# Patient Record
Sex: Male | Born: 1985 | Race: Black or African American | Hispanic: No | Marital: Married | Smoking: Current some day smoker
Health system: Southern US, Community
[De-identification: ages and names within clinical notes are randomized; demographics above are authoritative.]

---

## 2019-05-28 ENCOUNTER — Emergency Department (HOSPITAL_COMMUNITY): Payer: 59

## 2019-05-28 ENCOUNTER — Emergency Department (HOSPITAL_COMMUNITY)
Admission: EM | Admit: 2019-05-28 | Discharge: 2019-05-29 | Disposition: A | Discharge status: Home / Self Care | Payer: 59 | Attending: Emergency Medicine | Admitting: Emergency Medicine

## 2019-05-28 ENCOUNTER — Other Ambulatory Visit: Payer: Self-pay

## 2019-05-28 ENCOUNTER — Encounter (HOSPITAL_COMMUNITY): Payer: Self-pay | Admitting: Emergency Medicine

## 2019-05-28 DIAGNOSIS — Y999 Unspecified external cause status: Secondary | ICD-10-CM | POA: Insufficient documentation

## 2019-05-28 DIAGNOSIS — W109XXA Fall (on) (from) unspecified stairs and steps, initial encounter: Secondary | ICD-10-CM | POA: Diagnosis not present

## 2019-05-28 DIAGNOSIS — Y929 Unspecified place or not applicable: Secondary | ICD-10-CM | POA: Diagnosis not present

## 2019-05-28 DIAGNOSIS — Y9301 Activity, walking, marching and hiking: Secondary | ICD-10-CM | POA: Insufficient documentation

## 2019-05-28 DIAGNOSIS — F17210 Nicotine dependence, cigarettes, uncomplicated: Secondary | ICD-10-CM | POA: Insufficient documentation

## 2019-05-28 DIAGNOSIS — S82832A Other fracture of upper and lower end of left fibula, initial encounter for closed fracture: Secondary | ICD-10-CM | POA: Diagnosis not present

## 2019-05-28 DIAGNOSIS — S99912A Unspecified injury of left ankle, initial encounter: Secondary | ICD-10-CM | POA: Diagnosis present

## 2019-05-28 NOTE — ED Triage Notes (Addendum)
Pt here after "twisting" his LEFT ankle. Pt with obvious swelling to LEFT ankle.

## 2019-05-29 MED ORDER — IBUPROFEN 400 MG PO TABS
600.0000 mg | ORAL_TABLET | Freq: Once | ORAL | Status: AC
  Administered 2019-05-29: 600 mg via ORAL
  Filled 2019-05-29: qty 2

## 2019-05-29 MED ORDER — HYDROCODONE-ACETAMINOPHEN 5-325 MG PO TABS: 1.0000 | ORAL_TABLET | Freq: Four times a day (QID) | ORAL | 0 refills | Status: AC | PRN

## 2019-05-29 MED ORDER — HYDROCODONE-ACETAMINOPHEN 5-325 MG PO TABS
1.0000 | ORAL_TABLET | Freq: Once | ORAL | Status: AC
  Administered 2019-05-29: 1 via ORAL
  Filled 2019-05-29: qty 1

## 2019-05-29 NOTE — ED Provider Notes (Signed)
[facility] EMERGENCY DEPARTMENT Provider Note   CSN: [id number] Arrival date & time: 05/28/19  2301   Time seen 12:05 AM  History Chief Complaint  Patient presents with  . Ankle Pain    Nathan Huff is a 34 y.o. male.  HPI Patient states about 3 hours ago he was going down the steps off his porch and one of the steps broke and he fell twisting his left ankle.  He states he hit his left ribs and they are sore but he does not have significant pain or shortness of breath.  He states he has pain and difficulty putting weight on his left foot since he fell.  PCP Filbert Berthold, MD Orthopedics [facility] near [facility]    History reviewed. No pertinent past medical history.  There are no problems to display for this patient.   History reviewed. No pertinent surgical history.     History reviewed. No pertinent family history.  Social History   Tobacco Use  . Smoking status: Current Some Day Smoker  . Smokeless tobacco: Never Used  Substance Use Topics  . Alcohol use: Yes  . Drug use: Never  lives with spouse  Home Medications Prior to Admission medications   Medication Sig Start Date End Date Taking? Authorizing Provider  HYDROcodone-acetaminophen (NORCO/VICODIN) 5-325 MG tablet Take 1 tablet by mouth every 6 (six) hours as needed for moderate pain or severe pain. 05/29/19   Devoria Albe, MD    Allergies    Cortizone-5 [hydrocortisone]  Review of Systems   Review of Systems  All other systems reviewed and are negative.   Physical Exam Updated Vital Signs BP 133/78 (BP Location: Left Arm)   Pulse 79   Temp 98 F (36.7 C) (Oral)   Resp 15   Ht 6' (1.829 m)   Wt 63.5 kg   SpO2 98%   BMI 18.99 kg/m   Physical Exam Vitals and nursing note reviewed.  Constitutional:      Appearance: Normal appearance. He is normal weight.  HENT:     Head: Normocephalic and atraumatic.  Eyes:     Extraocular Movements: Extraocular movements intact.   Conjunctiva/sclera: Conjunctivae normal.     Pupils: Pupils are equal, round, and reactive to light.  Cardiovascular:     Rate and Rhythm: Normal rate and regular rhythm.     Pulses: Normal pulses.  Pulmonary:     Effort: Pulmonary effort is normal.  Musculoskeletal:        General: Swelling present.     Comments: Patient has a lot of bruising over his medial malleolus and especially over his lateral malleolus with early bruising seen.  He has some mild tenderness over the medial malleolus but he is most tender over the lateral malleolus.  He has good distal pulses.  He is nontender over his knee.  Skin:    General: Skin is warm and dry.     Findings: Bruising present.  Neurological:     General: No focal deficit present.     Mental Status: He is alert and oriented to person, place, and time.     Cranial Nerves: No cranial nerve deficit.  Psychiatric:        Mood and Affect: Mood normal.        Behavior: Behavior normal.        Thought Content: Thought content normal.    Left medial malleolus     Left Lateral malleolus     ED Results / Procedures /  Treatments   Labs (all labs ordered are listed, but only abnormal results are displayed) Labs Reviewed - No data to display  EKG None  Radiology DG Ankle Complete Left  Result Date: 05/28/2019 CLINICAL DATA:  [place], pain and swelling EXAM: LEFT ANKLE COMPLETE - 3+ VIEW COMPARISON:  None. FINDINGS: Frontal, oblique, and lateral views of the left ankle demonstrate an oblique lateral malleolar fracture which is minimally displaced. No other acute bony abnormalities. The ankle mortise is intact. There is severe anterior and lateral soft tissue edema. IMPRESSION: 1. Oblique lateral malleolar fracture with significant overlying soft tissue swelling. Electronically Signed   By: Randa Ngo M.D.   On: 05/28/2019 23:47    Procedures Procedures (including critical care time)  Medications Ordered in ED Medications  ibuprofen  (ADVIL) tablet 600 mg (has no administration in time range)  HYDROcodone-acetaminophen (NORCO/VICODIN) 5-325 MG per tablet 1 tablet (has no administration in time range)    ED Course  I have reviewed the triage vital signs and the nursing notes.  Pertinent labs & imaging results that were available during my care of the patient were reviewed by me and considered in my medical decision making (see chart for details).    MDM Rules/Calculators/A&P                     Patient does not want his rib soreness to be addressed.  We discussed his x-ray result.  He was placed in a cam walker and crutches.  He states he has an orthopedist group he has seen before near [facility]  He was advised to follow-up with them.  He was offered IM Toradol but he chose to take oral medications.  With the degree of swelling over his medial malleolus I suspect he also may have some type of tendon injury there that needs to be assessed by his orthopedist.    Final Clinical Impression(s) / ED Diagnoses Final diagnoses:  Closed fracture of distal end of left fibula, unspecified fracture morphology, initial encounter    Rx / DC Orders ED Discharge Orders         Ordered    HYDROcodone-acetaminophen (NORCO/VICODIN) 5-325 MG tablet  Every 6 hours PRN     05/29/19 0022        OTC ibuprofen  Plan discharge  Rolland Porter, MD, Barbette Or, MD 05/29/19 Benancio Deeds

## 2019-05-29 NOTE — Discharge Instructions (Signed)
Elevate your ankle to help lessen the swelling.  Use ice packs for swelling and for comfort.  Take ibuprofen 400 to 600 mg 4 times a day for pain.  Use the hydrocodone to help you sleep at night.  Use the crutches until you are able to bear weight on your left leg.  Use the cam walker whenever you are putting weight on that leg.  Please call [facility] to have them reevaluate your ankle soon.  You have the fracture of your distal fibula but I suspect you have a major sprain of your medial ankle also.

## 2019-07-01 ENCOUNTER — Ambulatory Visit
Admission: EM | Admit: 2019-07-01 | Discharge: 2019-07-01 | Disposition: A | Discharge status: Home / Self Care | Payer: 59 | Attending: Emergency Medicine | Admitting: Emergency Medicine

## 2019-07-01 DIAGNOSIS — S61211A Laceration without foreign body of left index finger without damage to nail, initial encounter: Secondary | ICD-10-CM

## 2019-07-01 MED ORDER — DOXYCYCLINE HYCLATE 100 MG PO CAPS
100.0000 mg | ORAL_CAPSULE | Freq: Two times a day (BID) | ORAL | 0 refills | Status: DC
Start: 2019-07-01 — End: 2022-05-24

## 2019-07-01 NOTE — Discharge Instructions (Addendum)
Clean with warm water and mild soap.   Apply a thin layer of neosporin.  Antibiotic was prescribed/take as directed Take OTC ibuprofen or tylenol as needed for pain releif Return sooner or go to the ED if you have any new or worsening symptoms such as increased pain, redness, swelling, drainage, discharge, decreased range of motion of extremity, etc..

## 2019-07-01 NOTE — ED Provider Notes (Addendum)
[facility]   [id number] 07/01/19 Arrival Time: 1245  Chief Complaint  Patient presents with   Hand Injury     SUBJECTIVE:  Laronn Kempa is a 34 y.o. male who presents to the urgent care for complaint of left index finger laceration  for the past 2 weeks.  Now reporting swelling and increased pain at the site.  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  Bleeding controlled.  Currently not on blood thinners.  Denies similar symptoms in the past.  Denies fever, chills, nausea, vomiting, redness, swelling, purulent drainage, decrease strength or sensation.   Td UTD: Yes.  ROS: As per HPI.  All other pertinent ROS negative.     History reviewed. No pertinent past medical history. History reviewed. No pertinent surgical history. Allergies  Allergen Reactions   Cortizone-5 [Hydrocortisone]    No current facility-administered medications on file prior to encounter.   Current Outpatient Medications on File Prior to Encounter  Medication Sig Dispense Refill   HYDROcodone-acetaminophen (NORCO/VICODIN) 5-325 MG tablet Take 1 tablet by mouth every 6 (six) hours as needed for moderate pain or severe pain. 15 tablet 0   Social History   Socioeconomic History   Marital status: Married    Spouse name: Not on file   Number of children: Not on file   Years of education: Not on file   Highest education level: Not on file  Occupational History   Not on file  Tobacco Use   Smoking status: Current Some Day Smoker   Smokeless tobacco: Never Used  Substance and Sexual Activity   Alcohol use: Yes   Drug use: Never   Sexual activity: Not on file  Other Topics Concern   Not on file  Social History Narrative   Not on file   Social Determinants of Health   Financial Resource Strain:    Difficulty of Paying Living Expenses:   Food Insecurity:    Worried About Programme researcher, broadcasting/film/video in the Last Year:    Barista in the Last Year:     Transportation Needs:    Freight forwarder (Medical):    Lack of Transportation (Non-Medical):   Physical Activity:    Days of Exercise per Week:    Minutes of Exercise per Session:   Stress:    Feeling of Stress :   Social Connections:    Frequency of Communication with Friends and Family:    Frequency of Social Gatherings with Friends and Family:    Attends Religious Services:    Active Member of Clubs or Organizations:    Attends Engineer, structural:    Marital Status:   Intimate Partner Violence:    Fear of Current or Ex-Partner:    Emotionally Abused:    Physically Abused:    Sexually Abused:    History reviewed. No pertinent family history.   OBJECTIVE:  Vitals:   07/01/19 1257  BP: 112/63  Pulse: 68  Resp: 18  Temp: 98 F (36.7 C)  SpO2: 97%     General appearance: alert; no distress Heart: RRR stable no murmur, rub or gallop Chest: CTA, heart sounds normal Skin: laceration of index finger; size: approx 0.5  cm Psychological: alert and cooperative; normal mood and affect   No results found for this or any previous visit.  Labs Reviewed - No data to display  No results found.  Procedure:   ASSESSMENT & PLAN:  1. Laceration of left index finger  without damage to nail, foreign body presence unspecified, initial encounter     Meds ordered this encounter  Medications   doxycycline (VIBRAMYCIN) 100 MG capsule    Sig: Take 1 capsule (100 mg total) by mouth 2 (two) times daily.    Dispense:  20 capsule    Refill:  0   Discharge Instructions Clean with warm water and mild soap.   Apply a thin layer of neosporin.  Antibiotic was prescribed/take as directed Take OTC ibuprofen or tylenol as needed for pain releif Return sooner or go to the ED if you have any new or worsening symptoms such as increased pain, redness, swelling, drainage, discharge, decreased range of motion of extremity, etc..     Reviewed expectations  re: course of current medical issues. Questions answered. Outlined signs and symptoms indicating need for more acute intervention. Patient verbalized understanding. After Visit Summary given.    Note: This document was prepared using Dragon voice recognition software and may include unintentional dictation errors.        Emerson Monte, [place] 07/01/19 1312

## 2019-07-01 NOTE — ED Triage Notes (Signed)
Pt presents with swelling to left finger after laceration 2 weeks ago

## 2021-07-10 IMAGING — DX DG ANKLE COMPLETE 3+V*L*
3 series · 3 of 3 positions shown · non-contrast
Comparison: None.

CLINICAL DATA: Fell, pain and swelling

EXAM:
LEFT ANKLE COMPLETE - 3+ VIEW

[ankle ap]
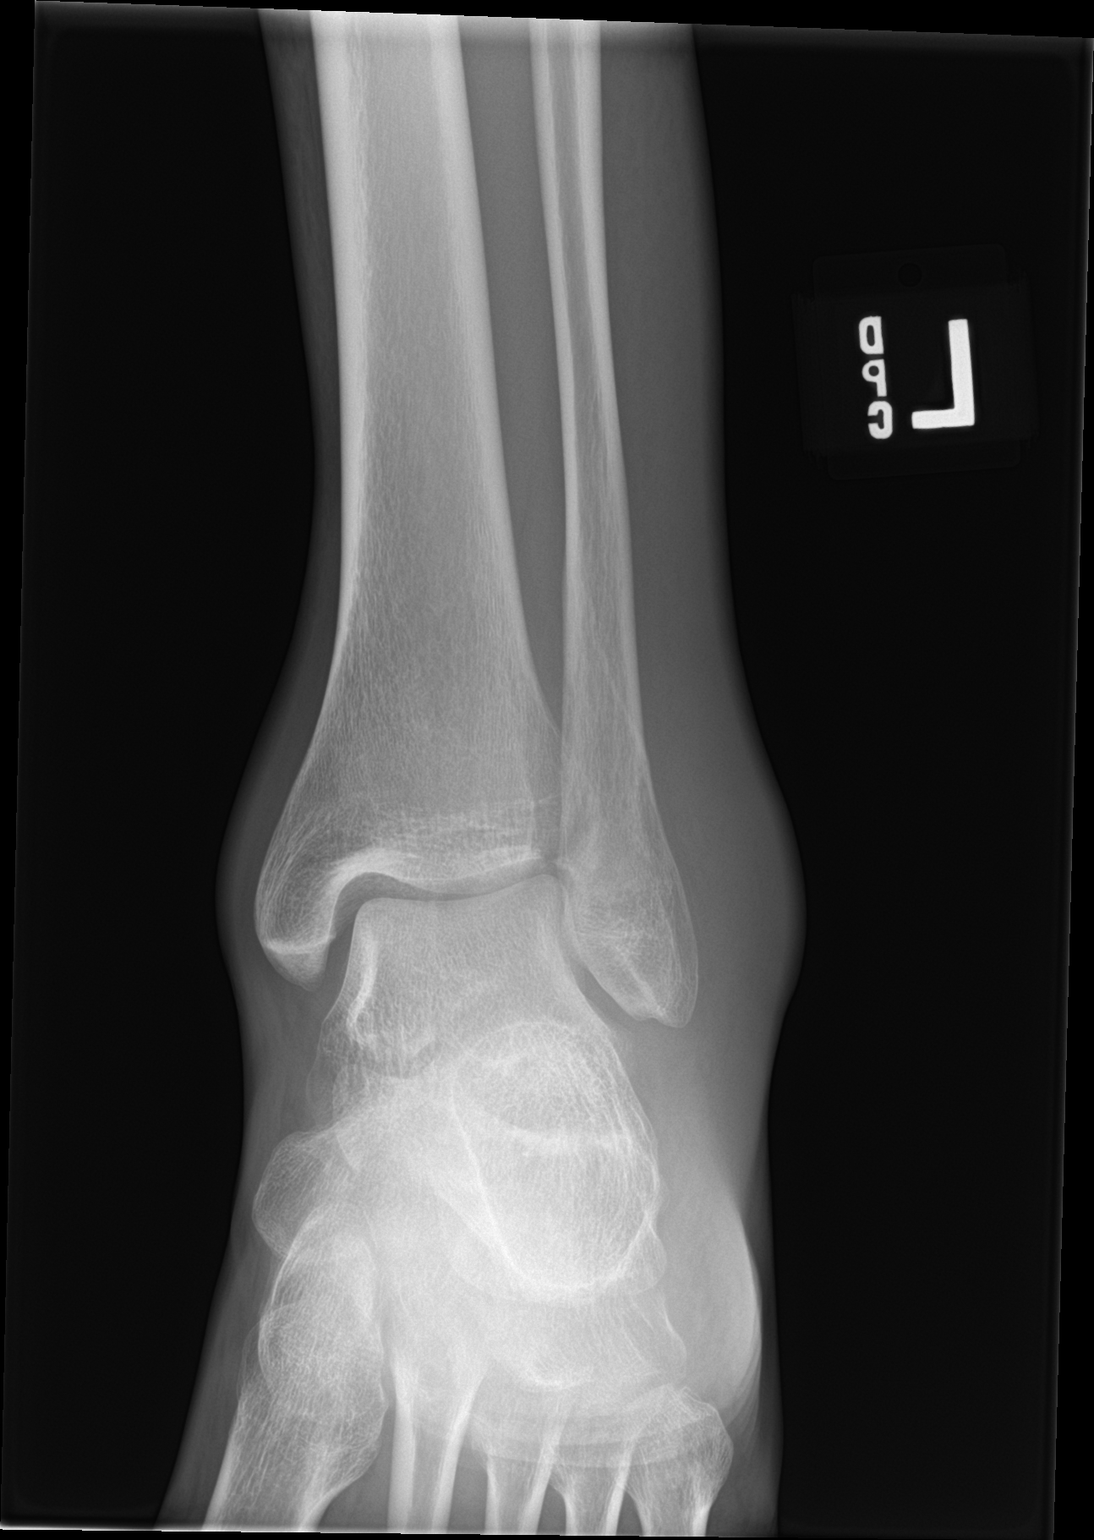

[ankle obl]
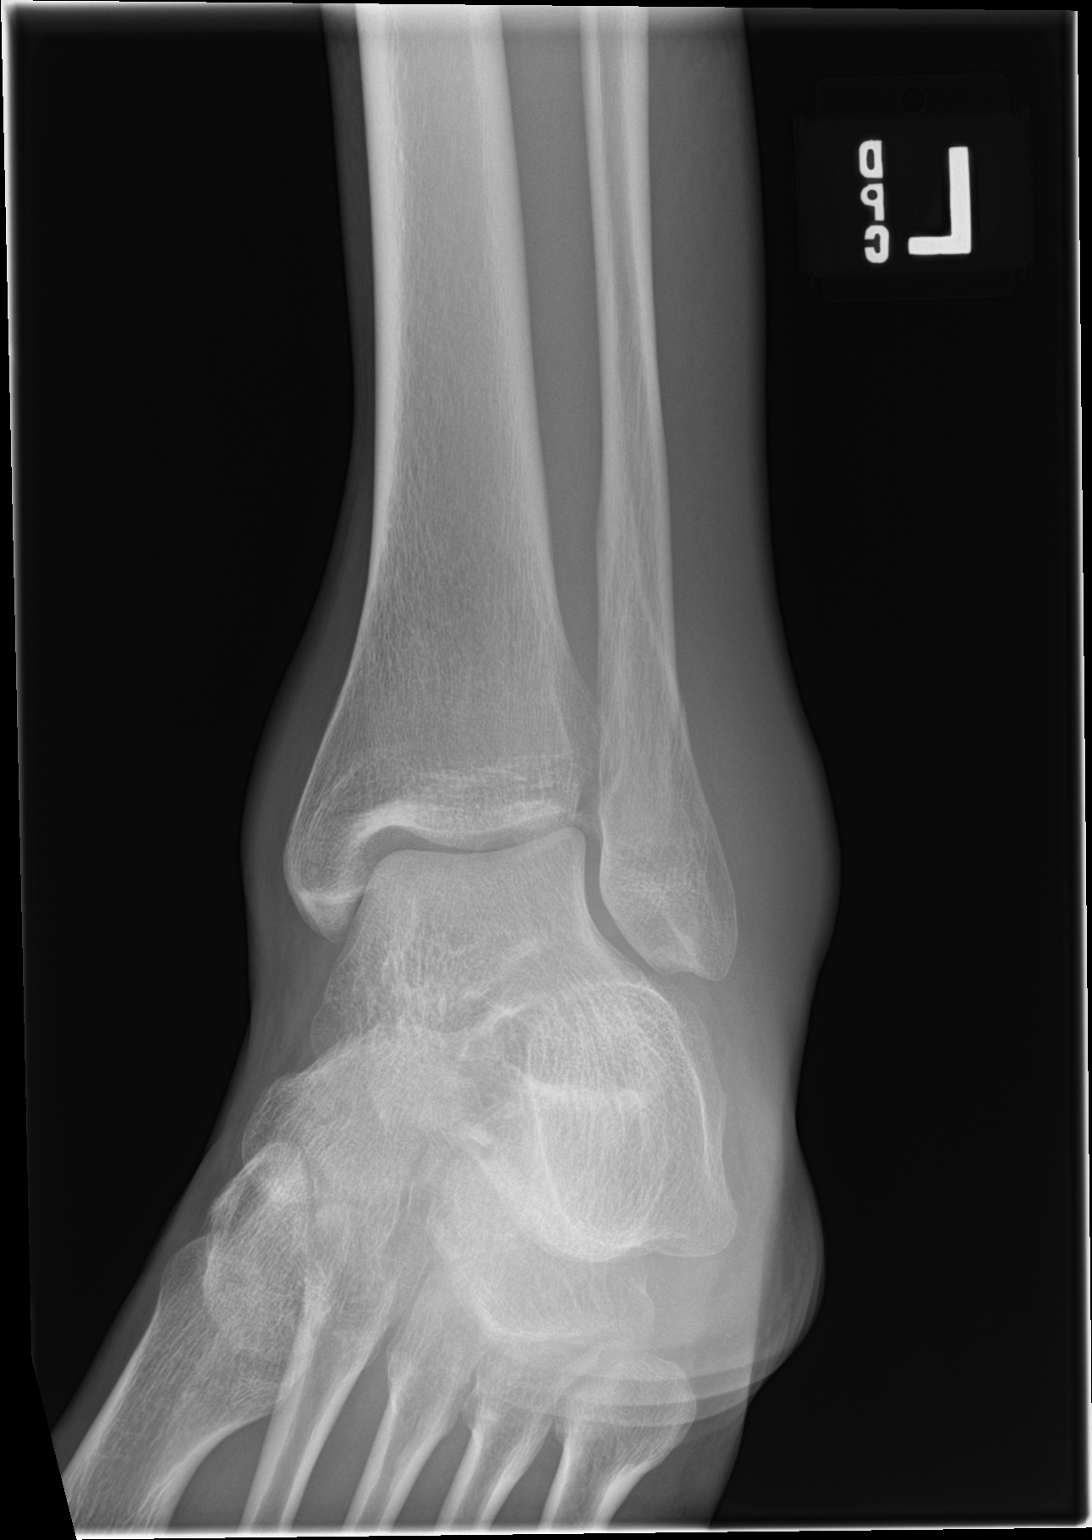

[ankle lat]
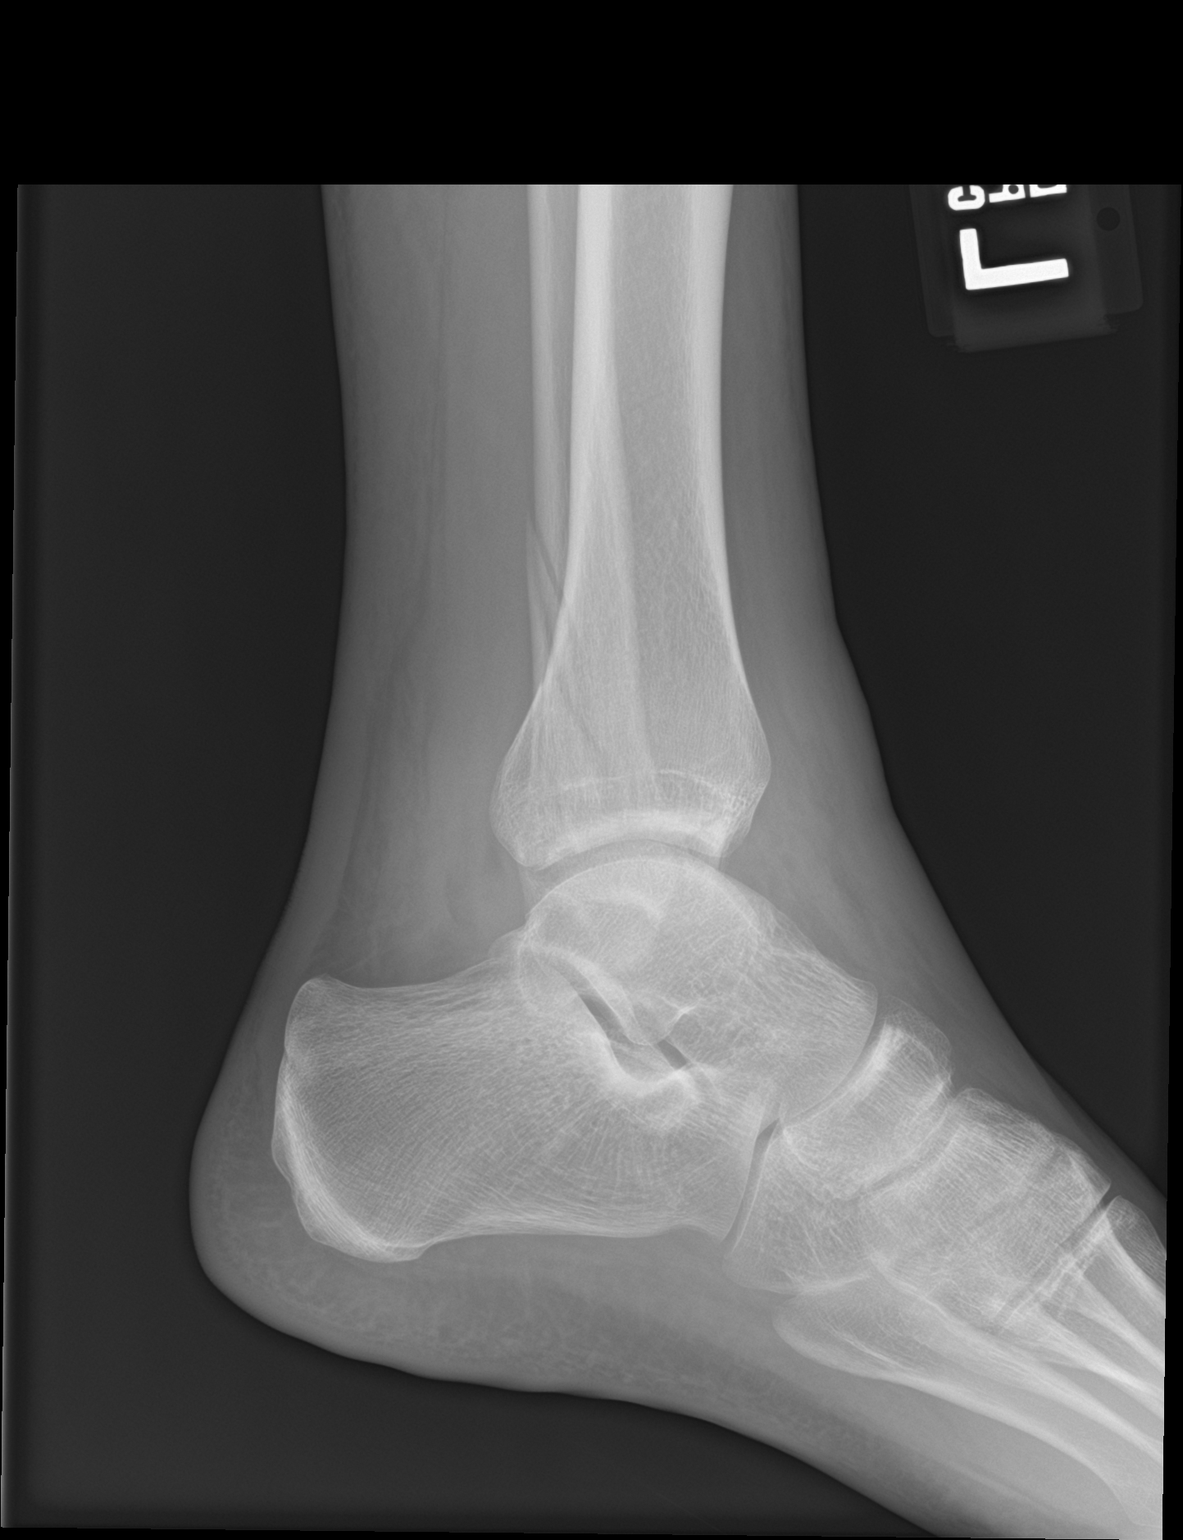

[3 of 3 positions shown; findings below may reference images not displayed]

FINDINGS: Frontal, oblique, and lateral views of the left ankle demonstrate an
oblique lateral malleolar fracture which is minimally displaced. No
other acute bony abnormalities. The ankle mortise is intact. There
is severe anterior and lateral soft tissue edema.
IMPRESSION: 1. Oblique lateral malleolar fracture with significant overlying
soft tissue swelling.

## 2022-05-24 ENCOUNTER — Emergency Department (HOSPITAL_COMMUNITY)
Admission: EM | Admit: 2022-05-24 | Discharge: 2022-05-24 | Disposition: A | Discharge status: Home / Self Care | Payer: Medicaid Other | Attending: Student | Admitting: Student

## 2022-05-24 ENCOUNTER — Other Ambulatory Visit: Payer: Self-pay

## 2022-05-24 DIAGNOSIS — L03115 Cellulitis of right lower limb: Secondary | ICD-10-CM

## 2022-05-24 DIAGNOSIS — M7989 Other specified soft tissue disorders: Secondary | ICD-10-CM | POA: Diagnosis present

## 2022-05-24 MED ORDER — DOXYCYCLINE HYCLATE 100 MG PO CAPS: 100.0000 mg | ORAL_CAPSULE | Freq: Two times a day (BID) | ORAL | 0 refills | Status: AC

## 2022-05-24 NOTE — ED Triage Notes (Signed)
Pt has noticed a "knot" on his right shin that has been there several days. Swelling has gone down but not entirely.  Pt states it was painful but not anymore. Pt has a procedure scheduled tomorrow to have some abscesses on his face and left elbow drained so he would just like someone to look at it and be sure it isn't anything dangerous.

## 2022-05-24 NOTE — Discharge Instructions (Addendum)
Seen today for a bump on your leg.  It is red and tender, looks like it might be an early abscess.  Will give you antibiotics.  You can use warm compresses.  If it is getting worse come back to the ER.  Was follow-up close with your primary care doctor.

## 2022-05-24 NOTE — ED Provider Notes (Signed)
[place] EMERGENCY DEPARTMENT AT [facility] Provider Note   CSN: [id number] Arrival date & time: 05/24/22  1725     History  Chief Complaint  Patient presents with   Cyst    Dustin Haddow is a 37 y.o. male.  Is a significant past medical history.  Presents the ER today complaining of a bump on the right anterior leg.  Several days ago he had woken up and noticed it was swollen and red.  He states it has been tender.  The swelling has gone down somewhat.  He states he is wanted to have it checked out because he wanted to make sure he was okay to have surgery to remove a cyst on his face tomorrow.  Denies any calf swelling or tenderness.  Denies any injury.  No numbness or tingling.  HPI     Home Medications Prior to Admission medications   Medication Sig Start Date End Date Taking? Authorizing Provider  doxycycline (VIBRAMYCIN) 100 MG capsule Take 1 capsule (100 mg total) by mouth 2 (two) times daily for 5 days. 05/24/22 05/29/22  Carmel Sacramento A, PA-C  HYDROcodone-acetaminophen (NORCO/VICODIN) 5-325 MG tablet Take 1 tablet by mouth every 6 (six) hours as needed for moderate pain or severe pain. 05/29/19   Devoria Albe, MD      Allergies    Cortizone-5 [hydrocortisone]    Review of Systems   Review of Systems  Physical Exam Updated Vital Signs BP 132/84 (BP Location: Right Arm)   Pulse 72   Temp 98 F (36.7 C) (Oral)   Resp 18   SpO2 100%  Physical Exam Vitals and nursing note reviewed.  Constitutional:      General: He is not in acute distress.    Appearance: He is well-developed.  HENT:     Head: Normocephalic and atraumatic.  Eyes:     Conjunctiva/sclera: Conjunctivae normal.  Cardiovascular:     Rate and Rhythm: Normal rate and regular rhythm.     Heart sounds: No murmur heard. Pulmonary:     Effort: Pulmonary effort is normal. No respiratory distress.     Breath sounds: Normal breath sounds.  Abdominal:     Palpations: Abdomen is soft.      Tenderness: There is no abdominal tenderness.  Musculoskeletal:        General: No swelling or tenderness.     Cervical back: Neck supple.     Right lower leg: No edema.     Left lower leg: No edema.  Skin:    General: Skin is warm and dry.     Capillary Refill: Capillary refill takes less than 2 seconds.     Comments: Approximately 1 cm diameter area of tenderness with some subcutaneous swelling, overlying erythema is mild.  Area is tender to palpation.  Neurological:     General: No focal deficit present.     Mental Status: He is alert and oriented to person, place, and time.  Psychiatric:        Mood and Affect: Mood normal.     ED Results / Procedures / Treatments   Labs (all labs ordered are listed, but only abnormal results are displayed) Labs Reviewed - No data to display  EKG None  Radiology No results found.  Procedures Ultrasound ED Soft Tissue  Date/Time: 05/24/2022 7:33 PM  Performed by: Ma Rings, PA-C Authorized by: Ma Rings, PA-C   Procedure details:    Indications: localization of abscess  Transverse view:  Visualized   Longitudinal view:  Visualized   Images: not archived   Location:    Side:  Right Findings:     no abscess present    cellulitis present    no foreign body present     Medications Ordered in ED Medications - No data to display  ED Course/ Medical Decision Making/ A&P                             Medical Decision Making DDX: Abscess, cellulitis, local reaction to insect bite, other ED course: Patient presents to ER with right anterior shin bump with redness and tenderness.  It was never itchy, no drainage.  He has a small area consistent with a small developing abscess.  As the redness has been more severe and is starting to go down on its own.  On bedside ultrasound there is a small hypoechoic area with some surrounding edema, no abscess.  No overlying cellulitis.  Discussed with patient this likely infection  but there is no indication for drainage as there is no definite abscess.  Will give him antibiotics.  He has no calf swelling or tenderness, no pitting edema to suggest DVT.  He was advised on close follow-up and strict return precautions  Risk Prescription drug management.           Final Clinical Impression(s) / ED Diagnoses Final diagnoses:  Cellulitis of right lower extremity    Rx / DC Orders ED Discharge Orders          Ordered    doxycycline (VIBRAMYCIN) 100 MG capsule  2 times daily        05/24/22 1850              Josem Kaufmann 05/24/22 1934    Lonell Grandchild, MD 05/25/22 [phone]

## 2023-02-15 ENCOUNTER — Ambulatory Visit
Admission: EM | Admit: 2023-02-15 | Discharge: 2023-02-15 | Disposition: A | Discharge status: Home / Self Care | Payer: Medicaid Other | Attending: Family Medicine | Admitting: Family Medicine

## 2023-02-15 DIAGNOSIS — H538 Other visual disturbances: Secondary | ICD-10-CM

## 2023-02-15 DIAGNOSIS — L0201 Cutaneous abscess of face: Secondary | ICD-10-CM | POA: Diagnosis not present

## 2023-02-15 DIAGNOSIS — H5712 Ocular pain, left eye: Secondary | ICD-10-CM | POA: Diagnosis not present

## 2023-02-15 MED ORDER — ERYTHROMYCIN 5 MG/GM OP OINT: TOPICAL_OINTMENT | OPHTHALMIC | 0 refills | Status: AC

## 2023-02-15 NOTE — ED Triage Notes (Signed)
Pt states ingrown hair to right cheek states the area is swollen.  States his left eye hurts and his vision is blurry.  States it has been for the past 3 days.

## 2023-02-15 NOTE — ED Provider Notes (Signed)
[facility]    CSN: [id number] Arrival date & time: 02/15/23  1449      History   Chief Complaint Chief Complaint  Patient presents with   Abscess    HPI Nathan Huff is a 38 y.o. male.   Patient presenting today with a resolving ingrown hair causing an abscess to the right cheek that he states he was able to get to drain spontaneously several days ago and is improving.  Since that started, he has had about 3 days of left upper eyelid/eye pain with moving the eye and blurry vision.  Denies eye redness, drainage, itching, fever, nausea, vomiting, headache, swelling, known injury to the eye.  So far trying lubricating drops with minimal relief.    History reviewed. No pertinent past medical history.  There are no active problems to display for this patient.   History reviewed. No pertinent surgical history.     Home Medications    Prior to Admission medications   Medication Sig Start Date End Date Taking? Authorizing Provider  erythromycin ophthalmic ointment Place a 1/2 inch ribbon of ointment into the left lower eyelid BID prn. 02/15/23  Yes Particia Nearing, PA-C  HYDROcodone-acetaminophen (NORCO/VICODIN) 5-325 MG tablet Take 1 tablet by mouth every 6 (six) hours as needed for moderate pain or severe pain. 05/29/19   Devoria Albe, MD    Family History History reviewed. No pertinent family history.  Social History Social History   Tobacco Use   Smoking status: Some Days   Smokeless tobacco: Never  Substance Use Topics   Alcohol use: Yes   Drug use: Never     Allergies   Cortizone-5 [hydrocortisone]   Review of Systems Review of Systems Per HPI  Physical Exam Triage Vital Signs ED Triage Vitals  Encounter Vitals Group     BP 02/15/23 1620 116/77     Systolic BP Percentile --      Diastolic BP Percentile --      Pulse Rate 02/15/23 1620 64     Resp 02/15/23 1620 16     Temp 02/15/23 1620 98 F (36.7 C)     Temp Source 02/15/23  1620 Oral     SpO2 02/15/23 1620 96 %     Weight --      Height --      Head Circumference --      Peak Flow --      Pain Score 02/15/23 1622 0     Pain Loc --      Pain Education --      Exclude from Growth Chart --    No data found.  Updated Vital Signs BP 116/77 (BP Location: Right Arm)   Pulse 64   Temp 98 F (36.7 C) (Oral)   Resp 16   SpO2 96%   Visual Acuity Right Eye Distance: 20/10 Left Eye Distance: 20/20 Bilateral Distance: 20/10  Right Eye Near:   Left Eye Near:    Bilateral Near:     Physical Exam Vitals and nursing note reviewed.  Constitutional:      Appearance: Normal appearance.  HENT:     Head: Atraumatic.  Eyes:     General:        Right eye: No discharge.        Left eye: No discharge.     Extraocular Movements: Extraocular movements intact.     Conjunctiva/sclera: Conjunctivae normal.     Pupils: Pupils are equal, round, and reactive to light.  Cardiovascular:     Rate and Rhythm: Normal rate and regular rhythm.  Pulmonary:     Effort: Pulmonary effort is normal.     Breath sounds: Normal breath sounds.  Musculoskeletal:        General: Normal range of motion.     Cervical back: Normal range of motion and neck supple.  Skin:    General: Skin is warm and dry.     Comments: Small hyperpigmented nodular lesion to the lower right cheek in area of recent abscess that has drained  Neurological:     General: No focal deficit present.     Mental Status: He is oriented to person, place, and time.     Motor: No weakness.     Gait: Gait normal.  Psychiatric:        Mood and Affect: Mood normal.        Thought Content: Thought content normal.        Judgment: Judgment normal.      UC Treatments / Results  Labs (all labs ordered are listed, but only abnormal results are displayed) Labs Reviewed - No data to display  EKG   Radiology No results found.  Procedures Procedures (including critical care time)  Medications Ordered in  UC Medications - No data to display  Initial Impression / Assessment and Plan / UC Course  I have reviewed the triage vital signs and the nursing notes.  Pertinent labs & imaging results that were available during my care of the patient were reviewed by me and considered in my medical decision making (see chart for details).     Overall exam very reassuring, visual acuity slightly less to the left eye but no obvious abnormalities on exam.  Will cover for possible abrasion or irritation with erythromycin ointment, lubricating drops, cool compresses and follow-up with ophthalmology if blurry vision does not improve.  Do not feel it is associated with the abscess which I reassured patient on.  Continue warm compresses, Neosporin for this as it appears to be resolving on its own.  Will forego antibiotic therapy at this time for this.  Final Clinical Impressions(s) / UC Diagnoses   Final diagnoses:  Left eye pain  Blurred vision  Facial abscess     Discharge Instructions      You may continue warm compresses, Neosporin and topical care for the ingrown hair bump to the cheek.  For the eye, I have sent over an antibiotic ointment and you may do the lubricating eyedrops as needed, cool compresses, ibuprofen.  Follow-up with an eye specialist if not fully resolving given your ongoing blurry vision    ED Prescriptions     Medication Sig Dispense Auth. Provider   erythromycin ophthalmic ointment Place a 1/2 inch ribbon of ointment into the left lower eyelid BID prn. 3.5 g Particia Nearing, PA-C      PDMP not reviewed this encounter.   Particia Nearing, [place] 02/15/23 [phone]

## 2023-02-15 NOTE — Discharge Instructions (Signed)
You may continue warm compresses, Neosporin and topical care for the ingrown hair bump to the cheek.  For the eye, I have sent over an antibiotic ointment and you may do the lubricating eyedrops as needed, cool compresses, ibuprofen.  Follow-up with an eye specialist if not fully resolving given your ongoing blurry vision
